# Patient Record
Sex: Female | Born: 1965 | Race: Black or African American | Hispanic: No | Marital: Single | Smoking: Current some day smoker
Health system: Southern US, Community
[De-identification: ages and names within clinical notes are randomized; demographics above are authoritative.]

## PROBLEM LIST (undated history)

## (undated) DIAGNOSIS — E7800 Pure hypercholesterolemia, unspecified: Secondary | ICD-10-CM

## (undated) HISTORY — PX: APPENDECTOMY: SHX54

---

## 2014-11-04 ENCOUNTER — Emergency Department (HOSPITAL_BASED_OUTPATIENT_CLINIC_OR_DEPARTMENT_OTHER)
Admission: EM | Admit: 2014-11-04 | Discharge: 2014-11-04 | Disposition: A | Discharge status: Home / Self Care | Payer: Medicaid Other | Attending: Emergency Medicine | Admitting: Emergency Medicine

## 2014-11-04 ENCOUNTER — Encounter (HOSPITAL_BASED_OUTPATIENT_CLINIC_OR_DEPARTMENT_OTHER): Payer: Self-pay | Admitting: *Deleted

## 2014-11-04 ENCOUNTER — Emergency Department (HOSPITAL_BASED_OUTPATIENT_CLINIC_OR_DEPARTMENT_OTHER): Payer: Medicaid Other

## 2014-11-04 DIAGNOSIS — E780 Pure hypercholesterolemia: Secondary | ICD-10-CM | POA: Diagnosis not present

## 2014-11-04 DIAGNOSIS — R220 Localized swelling, mass and lump, head: Secondary | ICD-10-CM | POA: Insufficient documentation

## 2014-11-04 DIAGNOSIS — Z720 Tobacco use: Secondary | ICD-10-CM | POA: Insufficient documentation

## 2014-11-04 DIAGNOSIS — R0981 Nasal congestion: Secondary | ICD-10-CM | POA: Diagnosis not present

## 2014-11-04 DIAGNOSIS — Z79899 Other long term (current) drug therapy: Secondary | ICD-10-CM | POA: Insufficient documentation

## 2014-11-04 HISTORY — DX: Pure hypercholesterolemia, unspecified: E78.00

## 2014-11-04 NOTE — ED Notes (Signed)
Since yesterday she has had pain, swelling, bruising and deformity to her nose. No known injury. She has been using ice.

## 2014-11-04 NOTE — Discharge Instructions (Signed)

## 2014-11-04 NOTE — ED Provider Notes (Signed)
CSN: [id number]     Arrival date & time 11/04/14  2119 History   This chart was scribed for Mirian Mo, MD by Doreatha Martin, ED Scribe. This patient was seen in room [facility] and the patient's care was started at 10:17 PM.     Chief Complaint  Patient presents with  . Facial Swelling   Patient is a 49 y.o. female presenting with URI. The history is provided by the patient. No language interpreter was used.  URI Presenting symptoms: congestion and facial pain   Presenting symptoms: no cough and no fever   Severity:  Moderate Progression:  Worsening Chronicity:  New Relieved by:  OTC medications (Ice) Associated symptoms: arthralgias, sinus pain and swollen glands ( bridge of the nose)   Associated symptoms comment:  Itching  HPI Comments: Jean Willis is a 49 y.o. female with Hx of HLD who presents to the Emergency Department complaining of moderate, gradually worsening swelling to the nose onset yesterday with associated ecchymosis, pruritic quality to the nose, nasal congestion. She states no known injury. She does note that she has been itching her nose frequently due to her allergies. Pt reports mild relief with ice application. Pt takes Zyrtec, Lipitor, omeprazole. She denies nausea, vomiting, fever, cough.     Past Medical History  Diagnosis Date  . High cholesterol    Past Surgical History  Procedure Laterality Date  . Appendectomy     No family history on file. Social History  Substance Use Topics  . Smoking status: Current Some Day Smoker  . Smokeless tobacco: None  . Alcohol Use: Yes   OB History    No data available     Review of Systems  Constitutional: Negative for fever.  HENT: Positive for congestion.   Respiratory: Negative for cough.   Musculoskeletal: Positive for joint swelling and arthralgias.  Skin: Positive for color change.  All other systems reviewed and are negative.  Allergies  Sulfa antibiotics  Home Medications   Prior to  Admission medications   Medication Sig Start Date End Date Taking? Authorizing Provider  ALBUTEROL IN Inhale into the lungs.   Yes Historical Provider, MD  Atorvastatin Calcium (LIPITOR PO) Take by mouth.   Yes Historical Provider, MD  Cetirizine HCl (ZYRTEC PO) Take by mouth.   Yes Historical Provider, MD  OMEPRAZOLE PO Take by mouth.   Yes Historical Provider, MD   BP 132/80 mmHg  Pulse 108  Temp(Src) 98.6 F (37 C) (Oral)  Resp 20  Ht 5\' 10"  (1.778 m)  Wt 185 lb (83.915 kg)  BMI 26.54 kg/m2  SpO2 98%  LMP 10/24/2014 Physical Exam  Constitutional: She is oriented to person, place, and time. She appears well-developed and well-nourished.  HENT:  Head: Normocephalic and atraumatic.  Right Ear: External ear normal.  Left Ear: External ear normal.  Swelling of R nasal bridge without ecchymosis, crepitus, or bony tenderness  Eyes: Conjunctivae and EOM are normal. Pupils are equal, round, and reactive to light.  Neck: Normal range of motion. Neck supple.  Cardiovascular: Normal rate, regular rhythm, normal heart sounds and intact distal pulses.   Pulmonary/Chest: Effort normal and breath sounds normal.  Abdominal: Soft. Bowel sounds are normal. There is no tenderness.  Musculoskeletal: Normal range of motion.  Neurological: She is alert and oriented to person, place, and time.  Skin: Skin is warm and dry.  Vitals reviewed.   ED Course  Procedures (including critical care time) DIAGNOSTIC STUDIES: Oxygen Saturation is 98% on  RA, normal by my interpretation.    COORDINATION OF CARE: 10:22 PM Discussed treatment plan with pt at bedside and pt agreed to plan.   Labs Review Labs Reviewed - No data to display  Imaging Review Dg Nasal Bones  11/04/2014   CLINICAL DATA:  Yesterday she has had pain, swelling, bruising and deformity to her nose. No known injury.  EXAM: NASAL BONES - 3+ VIEW  COMPARISON:  None.  FINDINGS: There is no evidence of fracture or other bone abnormality.   IMPRESSION: Negative.   Electronically Signed   By: Esperanza Heir M.D.   On: 11/04/2014 22:09   I have personally reviewed and evaluated these images and lab results as part of my medical decision-making.   EKG Interpretation None      MDM   Final diagnoses:  Nasal swelling    49 y.o. female without pertinent PMH presents with nasal swelling.  Atraumatic.  No fever.  Has had uri symptoms.  Exam as above.  Likely benign facial swelling from seasonal allergies.  DC home in stable condition.    I have reviewed all laboratory and imaging studies if ordered as above  1. Nasal swelling          Mirian Mo, MD 11/05/14 [phone]

## 2016-10-16 IMAGING — CR DG NASAL BONES 3+V
3 series · 3 of 3 positions shown · non-contrast
Comparison: None.

CLINICAL DATA: Yesterday she has had pain, swelling, bruising and
deformity to her nose. No known injury.

EXAM:
NASAL BONES - 3+ VIEW

[w waters *]
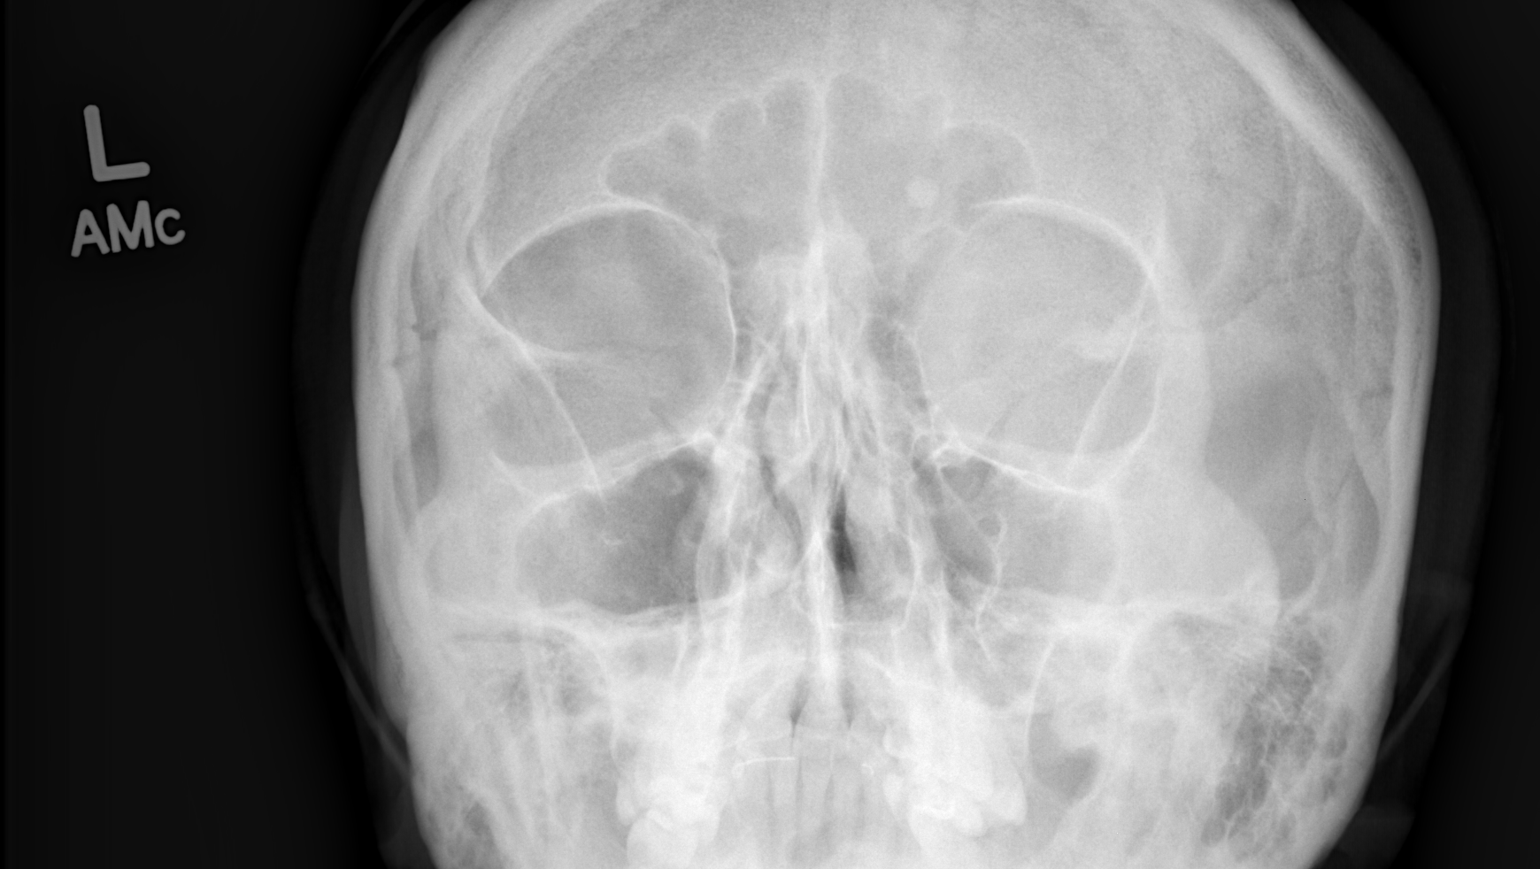

[w nasal bone lat * (1 of 2)]
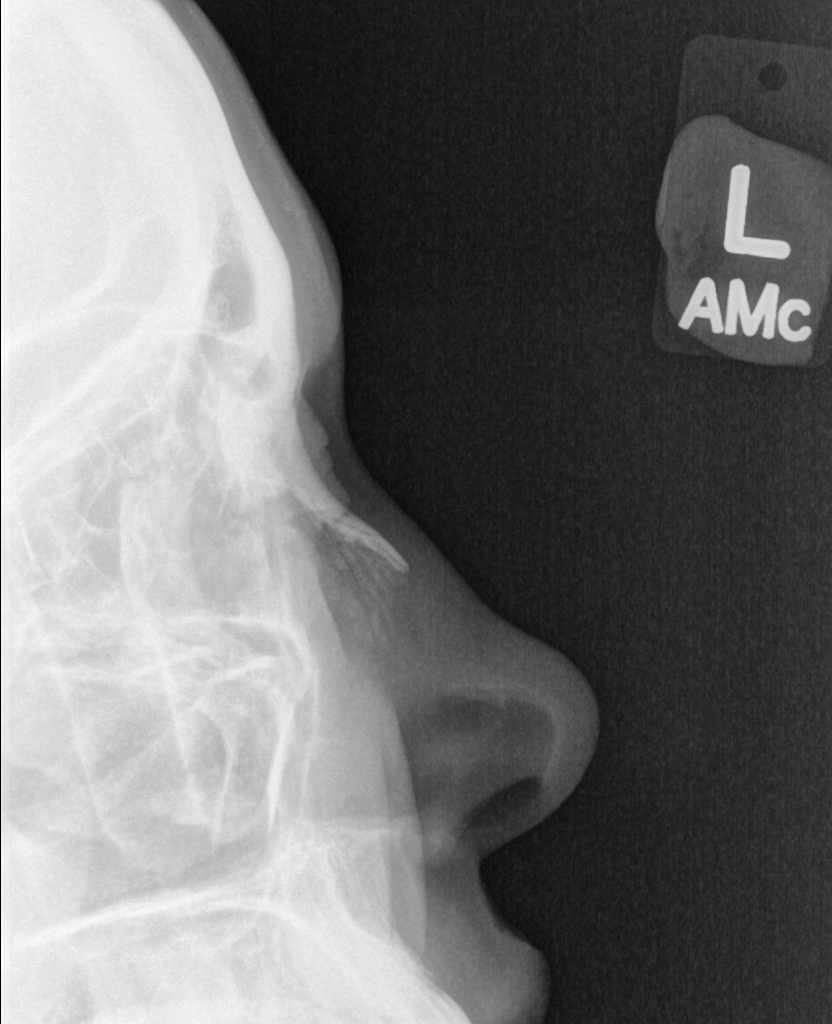

[w nasal bone lat * (2 of 2)]
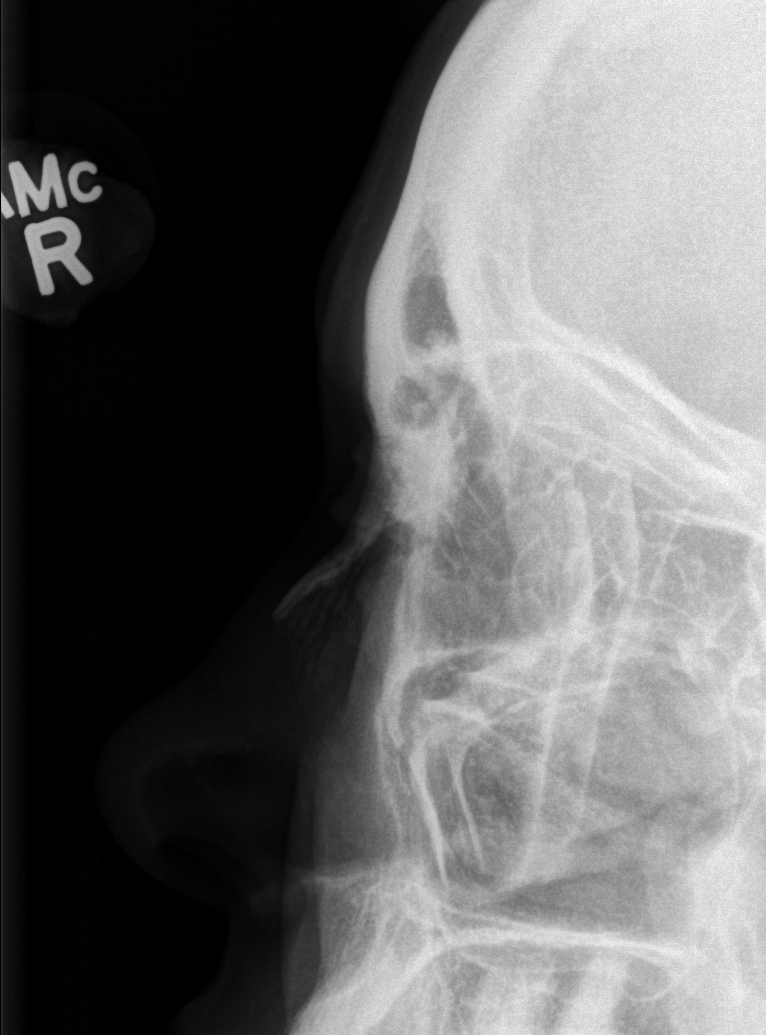

[3 of 3 positions shown; findings below may reference images not displayed]

FINDINGS: There is no evidence of fracture or other bone abnormality.
IMPRESSION: Negative.
